# Patient Record
Sex: Female | Born: 1935 | Race: Black or African American | Smoking: Never smoker
Health system: Southern US, Community
[De-identification: ages and names within clinical notes are randomized; demographics above are authoritative.]

## PROBLEM LIST (undated history)

## (undated) DIAGNOSIS — C189 Malignant neoplasm of colon, unspecified: Secondary | ICD-10-CM

## (undated) DIAGNOSIS — C50919 Malignant neoplasm of unspecified site of unspecified female breast: Secondary | ICD-10-CM

## (undated) DIAGNOSIS — M1990 Unspecified osteoarthritis, unspecified site: Secondary | ICD-10-CM

## (undated) DIAGNOSIS — I10 Essential (primary) hypertension: Secondary | ICD-10-CM

## (undated) DIAGNOSIS — E7800 Pure hypercholesterolemia, unspecified: Secondary | ICD-10-CM

## (undated) DIAGNOSIS — D472 Monoclonal gammopathy: Secondary | ICD-10-CM

## (undated) DIAGNOSIS — E079 Disorder of thyroid, unspecified: Secondary | ICD-10-CM

## (undated) HISTORY — DX: Monoclonal gammopathy: D47.2

## (undated) HISTORY — DX: Malignant neoplasm of unspecified site of unspecified female breast: C50.919

## (undated) HISTORY — DX: Malignant neoplasm of colon, unspecified: C18.9

## (undated) HISTORY — PX: COLON SURGERY: SHX602

## (undated) HISTORY — DX: Pure hypercholesterolemia, unspecified: E78.00

## (undated) HISTORY — DX: Essential (primary) hypertension: I10

## (undated) HISTORY — DX: Unspecified osteoarthritis, unspecified site: M19.90

## (undated) HISTORY — DX: Disorder of thyroid, unspecified: E07.9

---

## 1999-05-02 DIAGNOSIS — C50919 Malignant neoplasm of unspecified site of unspecified female breast: Secondary | ICD-10-CM

## 1999-05-02 HISTORY — PX: OTHER SURGICAL HISTORY: SHX169

## 1999-05-02 HISTORY — DX: Malignant neoplasm of unspecified site of unspecified female breast: C50.919

## 2012-02-20 ENCOUNTER — Other Ambulatory Visit (HOSPITAL_COMMUNITY): Payer: Self-pay | Admitting: Nurse Practitioner

## 2012-02-20 ENCOUNTER — Ambulatory Visit (HOSPITAL_COMMUNITY)
Admission: RE | Admit: 2012-02-20 | Discharge: 2012-02-20 | Disposition: A | Discharge status: Home / Self Care | Payer: Medicare Other | Source: Ambulatory Visit | Attending: Nurse Practitioner | Admitting: Nurse Practitioner

## 2012-02-20 DIAGNOSIS — M545 Low back pain, unspecified: Secondary | ICD-10-CM | POA: Insufficient documentation

## 2015-02-12 ENCOUNTER — Other Ambulatory Visit (HOSPITAL_COMMUNITY): Payer: Self-pay | Admitting: Nephrology

## 2015-02-12 DIAGNOSIS — N183 Chronic kidney disease, stage 3 unspecified: Secondary | ICD-10-CM

## 2015-02-25 ENCOUNTER — Ambulatory Visit (HOSPITAL_COMMUNITY)
Admission: RE | Admit: 2015-02-25 | Discharge: 2015-02-25 | Disposition: A | Discharge status: Home / Self Care | Payer: Medicare Other | Source: Ambulatory Visit | Attending: Nephrology | Admitting: Nephrology

## 2015-02-25 DIAGNOSIS — N183 Chronic kidney disease, stage 3 unspecified: Secondary | ICD-10-CM

## 2015-02-25 DIAGNOSIS — I129 Hypertensive chronic kidney disease with stage 1 through stage 4 chronic kidney disease, or unspecified chronic kidney disease: Secondary | ICD-10-CM | POA: Insufficient documentation

## 2015-04-07 ENCOUNTER — Encounter (HOSPITAL_COMMUNITY): Payer: Medicare Other | Attending: Oncology | Admitting: Oncology

## 2015-04-07 ENCOUNTER — Encounter (HOSPITAL_COMMUNITY): Payer: Self-pay | Admitting: Oncology

## 2015-04-07 VITALS — BP 132/69 | HR 98 | Temp 98.2°F | Resp 16 | Ht 63.75 in | Wt 251.9 lb

## 2015-04-07 DIAGNOSIS — Z85038 Personal history of other malignant neoplasm of large intestine: Secondary | ICD-10-CM | POA: Diagnosis not present

## 2015-04-07 DIAGNOSIS — Z853 Personal history of malignant neoplasm of breast: Secondary | ICD-10-CM

## 2015-04-07 DIAGNOSIS — N289 Disorder of kidney and ureter, unspecified: Secondary | ICD-10-CM | POA: Diagnosis not present

## 2015-04-07 DIAGNOSIS — D472 Monoclonal gammopathy: Secondary | ICD-10-CM | POA: Diagnosis present

## 2015-04-07 HISTORY — DX: Monoclonal gammopathy: D47.2

## 2015-04-07 LAB — CBC WITH DIFFERENTIAL/PLATELET
BASOS ABS: 0 10*3/uL (ref 0.0–0.1)
Basophils Relative: 0 %
EOS PCT: 2 %
Eosinophils Absolute: 0.2 10*3/uL (ref 0.0–0.7)
HCT: 41.4 % (ref 36.0–46.0)
Hemoglobin: 13.7 g/dL (ref 12.0–15.0)
LYMPHS PCT: 29 %
Lymphs Abs: 2.6 10*3/uL (ref 0.7–4.0)
MCH: 31.6 pg (ref 26.0–34.0)
MCHC: 33.1 g/dL (ref 30.0–36.0)
MCV: 95.4 fL (ref 78.0–100.0)
MONO ABS: 0.8 10*3/uL (ref 0.1–1.0)
Monocytes Relative: 9 %
Neutro Abs: 5.3 10*3/uL (ref 1.7–7.7)
Neutrophils Relative %: 60 %
PLATELETS: 223 10*3/uL (ref 150–400)
RBC: 4.34 MIL/uL (ref 3.87–5.11)
RDW: 13.5 % (ref 11.5–15.5)
WBC: 8.9 10*3/uL (ref 4.0–10.5)

## 2015-04-07 LAB — COMPREHENSIVE METABOLIC PANEL
ALT: 13 U/L — ABNORMAL LOW (ref 14–54)
ANION GAP: 10 (ref 5–15)
AST: 23 U/L (ref 15–41)
Albumin: 3.9 g/dL (ref 3.5–5.0)
Alkaline Phosphatase: 76 U/L (ref 38–126)
BUN: 19 mg/dL (ref 6–20)
CHLORIDE: 103 mmol/L (ref 101–111)
CO2: 26 mmol/L (ref 22–32)
Calcium: 9.2 mg/dL (ref 8.9–10.3)
Creatinine, Ser: 1.26 mg/dL — ABNORMAL HIGH (ref 0.44–1.00)
GFR, EST AFRICAN AMERICAN: 45 mL/min — AB (ref >60)
GFR, EST NON AFRICAN AMERICAN: 39 mL/min — AB (ref >60)
Glucose, Bld: 104 mg/dL — ABNORMAL HIGH (ref 65–99)
POTASSIUM: 4.1 mmol/L (ref 3.5–5.1)
Sodium: 139 mmol/L (ref 135–145)
Total Bilirubin: 0.5 mg/dL (ref 0.3–1.2)
Total Protein: 7.5 g/dL (ref 6.5–8.1)

## 2015-04-07 LAB — SEDIMENTATION RATE: SED RATE: 61 mm/h — AB (ref 0–22)

## 2015-04-07 LAB — C-REACTIVE PROTEIN: CRP: 0.9 mg/dL (ref <1.0)

## 2015-04-07 LAB — LACTATE DEHYDROGENASE: LDH: 173 U/L (ref 98–192)

## 2015-04-07 NOTE — Progress Notes (Signed)
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 Lowanda Foster for mild renal insufficiency.  I personally reviewed and went over laboratory results with the patient.  The results are noted within this dictation.  I personally reviewed and went over radiographic studies with the patient.  The results are noted within this dictation.    Chart reviewed.  The patient reports AM headaches daily since summer of 2016 with spontaneous resolution during the day.  She otherwise denies any other signs or symptoms of hyperviscosity including vision changes, loss of vision (transient and permanent), paresthesias, neuropathy, chest pain, abdominal pain, myalgias, weakness/fatigue, and B symptoms.  ROS is negative otherwise.  PAST MEDICAL HISTORY:   Past Medical History  Diagnosis Date  . Hypertension   . High cholesterol   . Arthritis   . Thyroid disease   . Breast cancer ([place]) 2001    lumpectomy and radiation therapy  . Colon cancer ([place])     30 years ago  . IgM monoclonal gammopathy of uncertain significance 04/07/2015    ALLERGIES: Not on File    MEDICATIONS: I have reviewed the patient's  current medications.    No current outpatient prescriptions on file prior to visit.   No current facility-administered medications on file prior to visit.     PAST SURGICAL HISTORY Past Surgical History  Procedure Laterality Date  . Lumpectomy Left 2001  . Colon surgery      30 years ago    FAMILY HISTORY: Family History  Problem Relation Age of Onset  . Stroke Mother   . Cancer Father   . Cancer Sister   . Diabetes Brother     SOCIAL HISTORY:  reports that she has never smoked. She has never used smokeless tobacco. She reports that she does not drink alcohol or use illicit drugs.  PERFORMANCE STATUS: The patient's performance status is 0 - Asymptomatic  PHYSICAL EXAM: Most Recent Vital Signs: Blood pressure 132/69, pulse 98, temperature 98.2 F (36.8 C), temperature source Oral, resp. rate 16, height 5' 3.75" (1.619 m), weight 251 lb 14.4 oz (114.261 kg), SpO2 99 %. General appearance: alert, cooperative, appears stated age, no distress, morbidly obese and accompanied by her two daughters Head: Normocephalic, without obvious abnormality, atraumatic Eyes: negative findings: lids and lashes normal, conjunctivae and sclerae normal, corneas clear and pupils equal, round, reactive to light and accomodation Throat: lips, mucosa, and tongue normal; teeth and gums normal Neck: supple, symmetrical, trachea midline Lungs: clear to auscultation bilaterally and normal percussion bilaterally Heart: regular rate and rhythm, S1, S2 normal, no murmur, click, rub or gallop Abdomen: soft, non-tender; bowel sounds normal; no masses,  no organomegaly Skin: Skin color, texture, turgor normal. No rashes or  lesions Lymph nodes: Cervical, supraclavicular, and axillary nodes normal. Neurologic: Alert and oriented X 3, normal strength and tone. Normal symmetric reflexes. Normal coordination and gait  LABORATORY DATA:  Results for orders placed or performed in visit on 04/07/15 (from the past 48  hour(s))  CBC with Differential     Status: None   Collection Time: 04/07/15  1:24 PM  Result Value Ref Range   WBC 8.9 4.0 - 10.5 K/uL   RBC 4.34 3.87 - 5.11 MIL/uL   Hemoglobin 13.7 12.0 - 15.0 g/dL   HCT 41.4 36.0 - 46.0 %   MCV 95.4 78.0 - 100.0 fL   MCH 31.6 26.0 - 34.0 pg   MCHC 33.1 30.0 - 36.0 g/dL   RDW 13.5 11.5 - 15.5 %   Platelets 223 150 - 400 K/uL   Neutrophils Relative % 60 %   Neutro Abs 5.3 1.7 - 7.7 K/uL   Lymphocytes Relative 29 %   Lymphs Abs 2.6 0.7 - 4.0 K/uL   Monocytes Relative 9 %   Monocytes Absolute 0.8 0.1 - 1.0 K/uL   Eosinophils Relative 2 %   Eosinophils Absolute 0.2 0.0 - 0.7 K/uL   Basophils Relative 0 %   Basophils Absolute 0.0 0.0 - 0.1 K/uL  Comprehensive metabolic panel     Status: Abnormal   Collection Time: 04/07/15  1:24 PM  Result Value Ref Range   Sodium 139 135 - 145 mmol/L   Potassium 4.1 3.5 - 5.1 mmol/L   Chloride 103 101 - 111 mmol/L   CO2 26 22 - 32 mmol/L   Glucose, Bld 104 (H) 65 - 99 mg/dL   BUN 19 6 - 20 mg/dL   Creatinine, Ser 1.26 (H) 0.44 - 1.00 mg/dL   Calcium 9.2 8.9 - 10.3 mg/dL   Total Protein 7.5 6.5 - 8.1 g/dL   Albumin 3.9 3.5 - 5.0 g/dL   AST 23 15 - 41 U/L   ALT 13 (L) 14 - 54 U/L   Alkaline Phosphatase 76 38 - 126 U/L   Total Bilirubin 0.5 0.3 - 1.2 mg/dL   GFR calc non Af Amer 39 (L) >60 mL/min   GFR calc Af Amer 45 (L) >60 mL/min    Comment: (NOTE) The eGFR has been calculated using the CKD EPI equation. This calculation has not been validated in all clinical situations. eGFR's persistently <60 mL/min signify possible Chronic Kidney Disease.    Anion gap 10 5 - 15  Lactate dehydrogenase     Status: None   Collection Time: 04/07/15  1:24 PM  Result Value Ref Range   LDH 173 98 - 192 U/L  Sedimentation rate     Status: Abnormal   Collection Time: 04/07/15  1:24 PM  Result Value Ref Range   Sed Rate 61 (H) 0 - 22 mm/hr  C-reactive protein     Status: None   Collection Time: 04/07/15  1:24 PM    Result Value Ref Range   CRP 0.9 <1.0 mg/dL    Comment: Performed at [facility]: No results found.     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  Given her lab results,  Waldenstrom Macroglobulinemia needs ruled out, in addition to multiple myeloma (active).  Other diagnoses include smoldering myeloma and MGUS.  Labs today: CBC diff, CMET, LDH, ESR, CRP, B2M, SPEP+IFE, IgG, IgA, IgM, B2M, and serum viscosity.  She will need a bone marrow aspiration and biopsy.  Based upon her anxiety and concern, and her body habitus, she will be best served to have this performed by IR in [place].  CT biopsy is ordered.  I reviewed in brief the procedure of a bone marrow aspiration and biopsy.  Given her clinical status and lab findings, waiting for this to be completed after Christmas is absolutely reasonable.  She will return in 4 weeks or so for follow-up and discussion regarding results.  Once diagnosis is made, the patient will be given detailed information regarding the diagnosis in addition to patient reading material.  Additionally, based upon her malignancy history, and a brief family history, we will dive in to more details regarding this to see if she is a candidate for genetic counseling.  All questions were answered. The patient knows to call the clinic with any problems, questions or concerns. We can certainly see the patient much sooner if necessary.  This note is electronically signed by: Molli Hazard, MD  04/07/2015 5:46 PM

## 2015-04-07 NOTE — Patient Instructions (Addendum)
[place] at [facility] Discharge Instructions  RECOMMENDATIONS MADE BY THE CONSULTANT AND ANY TEST RESULTS WILL BE SENT TO YOUR REFERRING PHYSICIAN.    Exam completed by Kirby Crigler PA today You also met with Dr Whitney Muse today We will set you up for bone marrow biopsy after Christmas, this will be done in [place] Return to see the doctor in January Please call the clinic if you have any questions or concerns    Thank you for choosing [place] at [facility] to provide your oncology and hematology care.  To afford each patient quality time with our provider, please arrive at least 15 minutes before your scheduled appointment time.    You need to re-schedule your appointment should you arrive 10 or more minutes late.  We strive to give you quality time with our providers, and arriving late affects you and other patients whose appointments are after yours.  Also, if you no show three or more times for appointments you may be dismissed from the clinic at the providers discretion.     Again, thank you for choosing [facility]  Our hope is that these requests will decrease the amount of time that you wait before being seen by our physicians.       _____________________________________________________________  Should you have questions after your visit to [facility], please contact our office at [phone] between the hours of 8:30 a.m. and 4:30 p.m.  Voicemails left after 4:30 p.m. will not be returned until the following business day.  For prescription refill requests, have your pharmacy contact our office.

## 2015-04-07 NOTE — Assessment & Plan Note (Addendum)
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  Given her lab results, Waldenstrom Macroglobulinemia needs ruled out, in addition to multiple myeloma (active).  Other diagnoses include smoldering myeloma and MGUS.  Labs today: CBC diff, CMET, LDH, ESR, CRP, B2M, SPEP+IFE, IgG, IgA, IgM, B2M, and serum viscosity.  She will need a bone marrow aspiration and biopsy.  Based upon her anxiety and concern, and her body habitus, she will be best served to have this performed by IR in [place].  CT biopsy is ordered.  I reviewed in brief the procedure of a bone marrow aspiration and biopsy.  Given her clinical status and lab findings, waiting for this to be completed after Christmas is absolutely reasonable.  She will return in 4 weeks or so for follow-up and discussion regarding results.  Once diagnosis is made, the patient will be given detailed information regarding the diagnosis in addition to patient reading material.  Additionally, based upon her malignancy history, and a brief family history, we will dive in to more details regarding this to see if she is a candidate for genetic counseling.

## 2015-04-08 LAB — IGG, IGA, IGM
IGG (IMMUNOGLOBIN G), SERUM: 1456 mg/dL (ref 700–1600)
IgA: 274 mg/dL (ref 64–422)
IgM, Serum: 132 mg/dL (ref 26–217)

## 2015-04-08 LAB — VISCOSITY, SERUM: VISCOSITY, SERUM: 1.7 rel.saline (ref 1.6–1.9)

## 2015-04-08 LAB — PROTEIN ELECTROPHORESIS, SERUM
A/G RATIO SPE: 1.1 (ref 0.7–1.7)
ALBUMIN ELP: 3.6 g/dL (ref 2.9–4.4)
ALPHA-1-GLOBULIN: 0.2 g/dL (ref 0.0–0.4)
ALPHA-2-GLOBULIN: 0.8 g/dL (ref 0.4–1.0)
Beta Globulin: 1.3 g/dL (ref 0.7–1.3)
GLOBULIN, TOTAL: 3.4 g/dL (ref 2.2–3.9)
Gamma Globulin: 1.1 g/dL (ref 0.4–1.8)
M-SPIKE, %: 0.3 g/dL — AB
TOTAL PROTEIN ELP: 7 g/dL (ref 6.0–8.5)

## 2015-04-08 LAB — BETA 2 MICROGLOBULIN, SERUM: Beta-2 Microglobulin: 2.7 mg/L — ABNORMAL HIGH (ref 0.6–2.4)

## 2015-04-09 LAB — KAPPA/LAMBDA LIGHT CHAINS
KAPPA, LAMDA LIGHT CHAIN RATIO: 1.97 — AB (ref 0.26–1.65)
Kappa free light chain: 37 mg/L — ABNORMAL HIGH (ref 3.30–19.40)
Lambda free light chains: 18.81 mg/L (ref 5.71–26.30)

## 2015-04-12 LAB — IMMUNOFIXATION ELECTROPHORESIS
IGG (IMMUNOGLOBIN G), SERUM: 1292 mg/dL (ref 700–1600)
IgA: 300 mg/dL (ref 64–422)
IgM, Serum: 146 mg/dL (ref 26–217)
TOTAL PROTEIN ELP: 6.9 g/dL (ref 6.0–8.5)

## 2015-04-27 ENCOUNTER — Other Ambulatory Visit: Payer: Self-pay | Admitting: Radiology

## 2015-04-28 ENCOUNTER — Other Ambulatory Visit: Payer: Self-pay | Admitting: Radiology

## 2015-04-29 ENCOUNTER — Ambulatory Visit (HOSPITAL_COMMUNITY)
Admission: RE | Admit: 2015-04-29 | Discharge: 2015-04-29 | Disposition: A | Discharge status: Home / Self Care | Payer: Medicare Other | Source: Ambulatory Visit | Attending: Oncology | Admitting: Oncology

## 2015-04-29 ENCOUNTER — Encounter (HOSPITAL_COMMUNITY): Payer: Self-pay

## 2015-04-29 DIAGNOSIS — E7800 Pure hypercholesterolemia, unspecified: Secondary | ICD-10-CM | POA: Diagnosis not present

## 2015-04-29 DIAGNOSIS — D472 Monoclonal gammopathy: Secondary | ICD-10-CM | POA: Diagnosis not present

## 2015-04-29 DIAGNOSIS — Z853 Personal history of malignant neoplasm of breast: Secondary | ICD-10-CM | POA: Insufficient documentation

## 2015-04-29 DIAGNOSIS — E079 Disorder of thyroid, unspecified: Secondary | ICD-10-CM | POA: Diagnosis not present

## 2015-04-29 DIAGNOSIS — Z85038 Personal history of other malignant neoplasm of large intestine: Secondary | ICD-10-CM | POA: Diagnosis not present

## 2015-04-29 DIAGNOSIS — I10 Essential (primary) hypertension: Secondary | ICD-10-CM | POA: Insufficient documentation

## 2015-04-29 LAB — CBC
HEMATOCRIT: 39.4 % (ref 36.0–46.0)
Hemoglobin: 12.7 g/dL (ref 12.0–15.0)
MCH: 30.6 pg (ref 26.0–34.0)
MCHC: 32.2 g/dL (ref 30.0–36.0)
MCV: 94.9 fL (ref 78.0–100.0)
PLATELETS: 217 10*3/uL (ref 150–400)
RBC: 4.15 MIL/uL (ref 3.87–5.11)
RDW: 13.8 % (ref 11.5–15.5)
WBC: 8.9 10*3/uL (ref 4.0–10.5)

## 2015-04-29 LAB — PROTIME-INR
INR: 1.1 (ref 0.00–1.49)
Prothrombin Time: 14.4 seconds (ref 11.6–15.2)

## 2015-04-29 LAB — APTT: aPTT: 32 seconds (ref 24–37)

## 2015-04-29 LAB — BONE MARROW EXAM

## 2015-04-29 MED ORDER — MIDAZOLAM HCL 2 MG/2ML IJ SOLN
INTRAMUSCULAR | Status: AC
  Filled 2015-04-29: qty 6

## 2015-04-29 MED ORDER — FENTANYL CITRATE (PF) 100 MCG/2ML IJ SOLN
INTRAMUSCULAR | Status: AC
  Filled 2015-04-29: qty 4

## 2015-04-29 MED ORDER — MIDAZOLAM HCL 2 MG/2ML IJ SOLN
INTRAMUSCULAR | Status: AC | PRN
  Administered 2015-04-29: 1 mg via INTRAVENOUS
  Administered 2015-04-29: 2 mg via INTRAVENOUS
  Administered 2015-04-29: 1 mg via INTRAVENOUS

## 2015-04-29 MED ORDER — SODIUM CHLORIDE 0.9 % IV SOLN
INTRAVENOUS | Status: DC
  Administered 2015-04-29: 10:00:00 via INTRAVENOUS

## 2015-04-29 MED ORDER — FENTANYL CITRATE (PF) 100 MCG/2ML IJ SOLN
INTRAMUSCULAR | Status: AC | PRN
  Administered 2015-04-29: 50 ug via INTRAVENOUS

## 2015-04-29 NOTE — H&P (Signed)
Chief Complaint: Patient was seen in consultation today for IgM monoclonal gammopathy at the request of [place] S  Referring Physician(s): Kefalas,Thomas S  History of Present Illness: Isabella Russell is a 81 y.o. female with IgM monoclonal gammopathy who has been seen by Oncology and scheduled today for image guided bone marrow biopsy. She denies any chest pain, shortness of breath or palpitations. She denies any active signs of bleeding or excessive bruising. She denies any recent fever or chills. The patient denies any history of sleep apnea or chronic oxygen use. She has previously tolerated sedation without complications. She does complain of back and knee pain.    Past Medical History  Diagnosis Date  . Hypertension   . High cholesterol   . Arthritis   . Thyroid disease   . Breast cancer ([place]) 2001    lumpectomy and radiation therapy  . Colon cancer ([place])     30 years ago  . IgM monoclonal gammopathy of uncertain significance 04/07/2015    Past Surgical History  Procedure Laterality Date  . Lumpectomy Left 2001  . Colon surgery      30 years ago    Allergies: Review of patient's allergies indicates not on file.  Medications: Prior to Admission medications   Medication Sig Start Date End Date Taking? Authorizing Provider  aspirin EC 81 MG tablet Take 81 mg by mouth.   Yes Historical Provider, MD  atorvastatin (LIPITOR) 10 MG tablet Take 10 mg by mouth.   Yes Historical Provider, MD  bumetanide (BUMEX) 0.5 MG tablet Take 0.5 mg by mouth daily. 03/26/15  Yes Historical Provider, MD  Cholecalciferol (VITAMIN D3) 10000 UNITS TABS Take 1,000 Units by mouth daily.   Yes Historical Provider, MD  levothyroxine (SYNTHROID, LEVOTHROID) 175 MCG tablet Take 175 mcg by mouth daily. 03/26/15  Yes Historical Provider, MD  omeprazole (PRILOSEC) 20 MG capsule Take 20 mg by mouth daily. 03/26/15  Yes Historical Provider, MD  traMADol (ULTRAM) 50 MG tablet Take 50 mg by  mouth as needed. 04/05/15  Yes Historical Provider, MD  valsartan-hydrochlorothiazide (DIOVAN-HCT) 160-25 MG tablet Take 1 tablet by mouth daily. 03/26/15  Yes Historical Provider, MD  loratadine (CLARITIN) 10 MG tablet Take 10 mg by mouth daily as needed for allergies.    Historical Provider, MD     Family History  Problem Relation Age of Onset  . Stroke Mother   . Cancer Father   . Cancer Sister   . Diabetes Brother     Social History   Social History  . Marital Status: Widowed    Spouse Name: N/A  . Number of Children: N/A  . Years of Education: N/A   Social History Main Topics  . Smoking status: Never Smoker   . Smokeless tobacco: Never Used  . Alcohol Use: No  . Drug Use: No  . Sexual Activity: Not Asked   Other Topics Concern  . None   Social History Narrative    Review of Systems: A 12 point ROS discussed and pertinent positives are indicated in the HPI above.  All other systems are negative.  Review of Systems  Vital Signs: BP 139/83 mmHg  Pulse 102  Temp(Src) 97.7 F (36.5 C) (Oral)  Resp 20  Ht _0  (1.6 m)  Wt 251 lb (113.853 kg)  BMI 44.47 kg/m2  SpO2 100%  Physical Exam  Constitutional: She is oriented to person, place, and time. No distress.  Cardiovascular: Regular rhythm.  Exam reveals no  gallop and no friction rub.   No murmur heard. Tachycardic   Pulmonary/Chest: Effort normal and breath sounds normal. No respiratory distress. She has no wheezes. She has no rales.  Abdominal: Soft. Bowel sounds are normal. She exhibits no distension. There is no tenderness.  Neurological: She is alert and oriented to person, place, and time.  Skin: Skin is warm and dry. She is not diaphoretic.    Mallampati Score:  MD Evaluation Airway: WNL Heart: WNL Abdomen: WNL Chest/ Lungs: WNL ASA  Classification: 2 Mallampati/Airway Score: Two  Imaging: No results found.  Labs:  CBC:  Recent Labs  04/07/15 1324 04/29/15 0855  WBC 8.9 8.9  HGB  13.7 12.7  HCT 41.4 39.4  PLT 223 217    COAGS: No results for input(s): INR, APTT in the last 8760 hours.  BMP:  Recent Labs  04/07/15 1324  NA 139  K 4.1  CL 103  CO2 26  GLUCOSE 104*  BUN 19  CALCIUM 9.2  CREATININE 1.26*  GFRNONAA 39*  GFRAA 45*    LIVER FUNCTION TESTS:  Recent Labs  04/07/15 1324  BILITOT 0.5  AST 23  ALT 13*  ALKPHOS 76  PROT 7.5  ALBUMIN 3.9    Assessment and Plan: IgM monoclonal gammopathy Seen by Oncology  Scheduled today for image guided bone marrow biopsy with sedation The patient has been NPO, no blood thinners taken, labs and vitals have been reviewed. Risks and Benefits discussed with the patient including, but not limited to education regarding the natural healing process of compression fractures without intervention, bleeding, infection, cement migration which may cause spinal cord damage, paralysis, pulmonary embolism or even death. All of the patient's questions were answered, patient is agreeable to proceed. Consent signed and in chart.   Thank you for this interesting consult.  I greatly enjoyed meeting Isabella Russell and look forward to participating in their care.  A copy of this report was sent to the requesting provider on this date.  SignedHedy Jacob 04/29/2015, 10:10 AM   I spent a total of 15 Minutes in face to face in clinical consultation, greater than 50% of which was counseling/coordinating care for IgM monoclonal gammopathy

## 2015-04-29 NOTE — Discharge Instructions (Signed)
Moderate Conscious Sedation, Adult, Care After Refer to this sheet in the next few weeks. These instructions provide you with information on caring for yourself after your procedure. Your health care provider may also give you more specific instructions. Your treatment has been planned according to current medical practices, but problems sometimes occur. Call your health care provider if you have any problems or questions after your procedure. WHAT TO EXPECT AFTER THE PROCEDURE  After your procedure:  You may feel sleepy, clumsy, and have poor balance for several hours.  Vomiting may occur if you eat too soon after the procedure. HOME CARE INSTRUCTIONS  Do not participate in any activities where you could become injured for at least 24 hours. Do not:  Drive.  Swim.  Ride a bicycle.  Operate heavy machinery.  Cook.  Use power tools.  Climb ladders.  Work from a high place.  Do not make important decisions or sign legal documents until you are improved.  If you vomit, drink water, juice, or soup when you can drink without vomiting. Make sure you have little or no nausea before eating solid foods.  Only take over-the-counter or prescription medicines for pain, discomfort, or fever as directed by your health care provider.  Make sure you and your family fully understand everything about the medicines given to you, including what side effects may occur.  You should not drink alcohol, take sleeping pills, or take medicines that cause drowsiness for at least 24 hours.  If you smoke, do not smoke without supervision.  If you are feeling better, you may resume normal activities 24 hours after you were sedated.  Keep all appointments with your health care provider. SEEK MEDICAL CARE IF:  Your skin is pale or bluish in color.  You continue to feel nauseous or vomit.  Your pain is getting worse and is not helped by medicine.  You have bleeding or swelling.  You are still  sleepy or feeling clumsy after 24 hours. SEEK IMMEDIATE MEDICAL CARE IF:  You develop a rash.  You have difficulty breathing.  You develop any type of allergic problem.  You have a fever. MAKE SURE YOU:  Understand these instructions.  Will watch your condition.  Will get help right away if you are not doing well or get worse.   This information is not intended to replace advice given to you by your health care provider. Make sure you discuss any questions you have with your health care provider.   Document Released: 02/05/2013 Document Revised: 05/08/2014 Document Reviewed: 02/05/2013 Elsevier Interactive Patient Education 2016 [place].   Bone Marrow Aspiration and Bone Marrow Biopsy, Care After Refer to this sheet in the next few weeks. These instructions provide you with information about caring for yourself after your procedure. Your health care provider may also give you more specific instructions. Your treatment has been planned according to current medical practices, but problems sometimes occur. Call your health care provider if you have any problems or questions after your procedure. WHAT TO EXPECT AFTER THE PROCEDURE After your procedure, it is common to have:  Soreness or tenderness around the puncture site.  Bruising. HOME CARE INSTRUCTIONS  Take medicines only as directed by your health care provider.  Follow your health care provider's instructions about:  Puncture site care.  Bandage (dressing) changes and removal.  Bathe and shower as directed by your health care provider.  Check your puncture site every day for signs of infection. Watch for:  Redness, swelling,  or pain.  Fluid, blood, or pus.  Return to your normal activities as directed by your health care provider.  Keep all follow-up visits as directed by your health care provider. This is important. SEEK MEDICAL CARE IF:  You have a fever.  You have uncontrollable bleeding.  You  have redness, swelling, or pain at the site of your puncture.  You have fluid, blood, or pus coming from your puncture site.   This information is not intended to replace advice given to you by your health care provider. Make sure you discuss any questions you have with your health care provider.   Document Released: 11/04/2004 Document Revised: 09/01/2014 Document Reviewed: 04/08/2014 Elsevier Interactive Patient Education [employer].

## 2015-04-29 NOTE — Procedures (Signed)
Interventional Radiology Procedure Note  Procedure: CT guided aspirate and core biopsy of right iliac bone Complications: None Recommendations: - Bedrest supine x 1 hrs - OTC's for  Pain - Follow biopsy results  Signed,  Dulcy Fanny. Earleen Newport, DO

## 2015-05-10 ENCOUNTER — Ambulatory Visit (HOSPITAL_COMMUNITY): Payer: Medicare Other | Admitting: Hematology & Oncology

## 2015-05-10 LAB — TISSUE HYBRIDIZATION (BONE MARROW)-NCBH

## 2015-05-10 LAB — CHROMOSOME ANALYSIS, BONE MARROW

## 2015-05-10 NOTE — Progress Notes (Signed)
This encounter was created in error - please disregard.

## 2015-05-17 ENCOUNTER — Encounter (HOSPITAL_COMMUNITY): Payer: Self-pay

## 2016-11-06 IMAGING — CT CT BIOPSY
1 series · 1 of 25 positions shown · non-contrast
Comparison: none

CLINICAL DATA: 83-year-old female with a history of monoclonal
gammopathy.

[Series 2: localizer · axial · 5.0mm · 0.54mm/px · 1 of 25 slices shown]
[im 13/25]
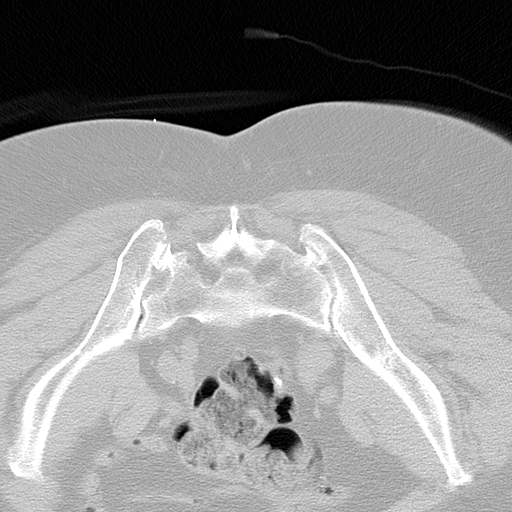

[1 of 25 positions shown; findings below may reference images not displayed]

EXAM:
CT-GUIDED BIOPSY BONE MARROW

MEDICATIONS AND MEDICAL HISTORY:
Versed 4.0 mg, Fentanyl 50 mcg.

Additional Medications: None.

ANESTHESIA/SEDATION:
Moderate sedation time: 8 minutes

PROCEDURE:
The procedure risks, benefits, and alternatives were explained to
the patient. Questions regarding the procedure were encouraged and
answered. The patient understands and consents to the procedure.

Scout CT of the pelvis was performed for surgical planning purposes.

The posterior pelvis was prepped with Betadinein a sterile fashion,
and a sterile drape was applied covering the operative field. A
sterile gown and sterile gloves were used for the procedure. Local
anesthesia was provided with 1% Lidocaine.

We targeted the right posterior iliac bone for biopsy. The skin and
subcutaneous tissues were infiltrated with 1% lidocaine without
epinephrine. A small stab incision was made with an 11 blade
scalpel, and an 11 gauge Rukhadze needle was advanced with CT guidance
to the posterior cortex. Manual forced was used to advance the
needle through the posterior cortex and the stylet was removed. A
bone marrow aspirate was retrieved and passed to a cytotechnologist
in the room. The Rukhadze needle was then advanced without the stylet
for a core biopsy. The core biopsy was retrieved and also passed to
a cytotechnologist.

Manual pressure was used for hemostasis and a sterile dressing was
placed.

No complications were encountered no significant blood loss was
encountered.

Patient tolerated the procedure well and remained hemodynamically
stable throughout.
FINDINGS: Scout image demonstrates safe approach to posterior iliac bone.

Images during the case demonstrate placement of 11 gauge Rukhadze
needle

COMPLICATIONS:
None
IMPRESSION: Status post CT-guided bone marrow biopsy, with tissue specimen sent
to pathology for complete histopathologic analysis
